# Patient Record
Sex: Male | Born: 1990 | Race: White | Hispanic: No | Marital: Married
Health system: Southern US, Community
[De-identification: ages and names within clinical notes are randomized; demographics above are authoritative.]

## PROBLEM LIST (undated history)

## (undated) HISTORY — PX: TONSILLECTOMY: SUR1361

---

## 2013-05-31 ENCOUNTER — Other Ambulatory Visit: Payer: Self-pay | Admitting: Physician Assistant

## 2013-05-31 ENCOUNTER — Ambulatory Visit
Admission: RE | Admit: 2013-05-31 | Discharge: 2013-05-31 | Disposition: A | Discharge status: Home / Self Care | Payer: 59 | Source: Ambulatory Visit | Attending: Physician Assistant | Admitting: Physician Assistant

## 2013-05-31 DIAGNOSIS — IMO0001 Reserved for inherently not codable concepts without codable children: Secondary | ICD-10-CM

## 2015-07-20 ENCOUNTER — Encounter (HOSPITAL_COMMUNITY): Payer: Self-pay | Admitting: Emergency Medicine

## 2015-07-20 ENCOUNTER — Emergency Department (HOSPITAL_COMMUNITY): Payer: BC Managed Care – PPO

## 2015-07-20 ENCOUNTER — Emergency Department (HOSPITAL_COMMUNITY)
Admission: EM | Admit: 2015-07-20 | Discharge: 2015-07-20 | Disposition: A | Discharge status: Home / Self Care | Payer: BC Managed Care – PPO | Attending: Emergency Medicine | Admitting: Emergency Medicine

## 2015-07-20 DIAGNOSIS — R1031 Right lower quadrant pain: Secondary | ICD-10-CM

## 2015-07-20 DIAGNOSIS — K5900 Constipation, unspecified: Secondary | ICD-10-CM | POA: Diagnosis not present

## 2015-07-20 DIAGNOSIS — K5792 Diverticulitis of intestine, part unspecified, without perforation or abscess without bleeding: Secondary | ICD-10-CM | POA: Diagnosis not present

## 2015-07-20 LAB — URINE MICROSCOPIC-ADD ON: Bacteria, UA: NONE SEEN

## 2015-07-20 LAB — URINALYSIS, ROUTINE W REFLEX MICROSCOPIC
Glucose, UA: NEGATIVE mg/dL
Hgb urine dipstick: NEGATIVE
Ketones, ur: NEGATIVE mg/dL
Leukocytes, UA: NEGATIVE
Nitrite: NEGATIVE
Protein, ur: 30 mg/dL — AB
Specific Gravity, Urine: 1.036 — ABNORMAL HIGH (ref 1.005–1.030)
pH: 6 (ref 5.0–8.0)

## 2015-07-20 LAB — COMPREHENSIVE METABOLIC PANEL
ALT: 48 U/L (ref 17–63)
AST: 22 U/L (ref 15–41)
Albumin: 4.2 g/dL (ref 3.5–5.0)
Alkaline Phosphatase: 35 U/L — ABNORMAL LOW (ref 38–126)
Anion gap: 12 (ref 5–15)
BUN: 17 mg/dL (ref 6–20)
CO2: 26 mmol/L (ref 22–32)
Calcium: 9.2 mg/dL (ref 8.9–10.3)
Chloride: 100 mmol/L — ABNORMAL LOW (ref 101–111)
Creatinine, Ser: 0.98 mg/dL (ref 0.61–1.24)
GFR calc Af Amer: >60 mL/min (ref >60)
GFR calc non Af Amer: >60 mL/min (ref >60)
Glucose, Bld: 102 mg/dL — ABNORMAL HIGH (ref 65–99)
Potassium: 3.7 mmol/L (ref 3.5–5.1)
Sodium: 138 mmol/L (ref 135–145)
Total Bilirubin: 0.9 mg/dL (ref 0.3–1.2)
Total Protein: 8.3 g/dL — ABNORMAL HIGH (ref 6.5–8.1)

## 2015-07-20 LAB — CBC
HCT: 45.8 % (ref 39.0–52.0)
Hemoglobin: 15.2 g/dL (ref 13.0–17.0)
MCH: 30.9 pg (ref 26.0–34.0)
MCHC: 33.2 g/dL (ref 30.0–36.0)
MCV: 93.1 fL (ref 78.0–100.0)
Platelets: 258 10*3/uL (ref 150–400)
RBC: 4.92 MIL/uL (ref 4.22–5.81)
RDW: 12.6 % (ref 11.5–15.5)
WBC: 15.2 10*3/uL — ABNORMAL HIGH (ref 4.0–10.5)

## 2015-07-20 LAB — LIPASE, BLOOD: Lipase: 20 U/L (ref 11–51)

## 2015-07-20 MED ORDER — OXYCODONE-ACETAMINOPHEN 5-325 MG PO TABS: 1.0000 | ORAL_TABLET | ORAL | Status: AC | PRN

## 2015-07-20 MED ORDER — MORPHINE SULFATE (PF) 4 MG/ML IV SOLN
4.0000 mg | Freq: Once | INTRAVENOUS | Status: AC
  Administered 2015-07-20: 4 mg via INTRAVENOUS
  Filled 2015-07-20: qty 1

## 2015-07-20 MED ORDER — ONDANSETRON 4 MG PO TBDP: 4.0000 mg | ORAL_TABLET | Freq: Three times a day (TID) | ORAL | Status: AC | PRN

## 2015-07-20 MED ORDER — METRONIDAZOLE 500 MG PO TABS: 500.0000 mg | ORAL_TABLET | Freq: Two times a day (BID) | ORAL | Status: AC

## 2015-07-20 MED ORDER — SODIUM CHLORIDE 0.9 % IV BOLUS (SEPSIS)
1000.0000 mL | Freq: Once | INTRAVENOUS | Status: AC
  Administered 2015-07-20: 1000 mL via INTRAVENOUS

## 2015-07-20 MED ORDER — CIPROFLOXACIN HCL 500 MG PO TABS: 500.0000 mg | ORAL_TABLET | Freq: Two times a day (BID) | ORAL | Status: AC

## 2015-07-20 MED ORDER — IOPAMIDOL (ISOVUE-300) INJECTION 61%
100.0000 mL | Freq: Once | INTRAVENOUS | Status: AC | PRN
  Administered 2015-07-20: 100 mL via INTRAVENOUS

## 2015-07-20 NOTE — Discharge Instructions (Signed)
1. Medications: cipro and flagyl (antibiotics), percocet for pain, zofran for nausea, usual home medications 2. Treatment: rest, drink plenty of fluids 3. Follow Up: please followup with gastroenterology this week for discussion of your diagnoses and further evaluation after today's visit; please return to the ER for high fever, severe pain, vomiting, new or worsening symptoms    Diverticulitis Diverticulitis is when small pockets that have formed in your colon (large intestine) become infected or swollen. HOME CARE  Follow your doctor's instructions.  Follow a special diet if told by your doctor.  When you feel better, your doctor may tell you to change your diet. You may be told to eat a lot of fiber. Fruits and vegetables are good sources of fiber. Fiber makes it easier to poop (have bowel movements).  Take supplements or probiotics as told by your doctor.  Only take medicines as told by your doctor.  Keep all follow-up visits with your doctor. GET HELP IF:  Your pain does not get better.  You have a hard time eating food.  You are not pooping like normal. GET HELP RIGHT AWAY IF:  Your pain gets worse.  Your problems do not get better.  Your problems suddenly get worse.  You have a fever.  You keep throwing up (vomiting).  You have bloody or black, tarry poop (stool). MAKE SURE YOU:   Understand these instructions.  Will watch your condition.  Will get help right away if you are not doing well or get worse.   This information is not intended to replace advice given to you by your health care provider. Make sure you discuss any questions you have with your health care provider.   Document Released: 09/14/2007 Document Revised: 04/02/2013 Document Reviewed: 02/20/2013 Elsevier Interactive Patient Education [employer]2016 Elsevier Inc.

## 2015-07-20 NOTE — ED Provider Notes (Signed)
CSN: [id number]     Arrival date & time 07/20/15  1251 History   First MD Initiated Contact with Patient 07/20/15 1512     Chief Complaint  Patient presents with  . Abdominal Pain  . Diarrhea    HPI   Dan Wright is a 25 y.o. male with no pertinent PMH who presents to the ED with RLQ abdominal pain. He states his symptoms initially started 2 weeks ago, though improved and recurred Thursday. He notes progressively worsening pain since that time. He denies exacerbating or alleviating factors. He states he has had fever to 100. He reports vomiting on Friday and nausea throughout the weekend, now resolved. He also states he has had both diarrhea and constipation, and notes his last bowel movement was this afternoon and was loose. He denies hematochezia, melena, dysuria, urgency, frequency, penile pain/swelling/discharge, testicular pain/swelling. He notes he was evaluated by urgent care prior to arrival and sent to the ED for imaging.    History reviewed. No pertinent past medical history. Past Surgical History  Procedure Laterality Date  . Tonsillectomy     History reviewed. No pertinent family history. Social History  Substance Use Topics  . Smoking status: None  . Smokeless tobacco: None  . Alcohol Use: None     Review of Systems  Constitutional: Positive for fever. Negative for chills.  Respiratory: Negative for shortness of breath.   Cardiovascular: Negative for chest pain.  Gastrointestinal: Positive for nausea, vomiting, abdominal pain, diarrhea and constipation. Negative for blood in stool.  Genitourinary: Negative for dysuria, urgency, frequency, hematuria, penile swelling, scrotal swelling, penile pain and testicular pain.  All other systems reviewed and are negative.     Allergies  Review of patient's allergies indicates no known allergies.  Home Medications   Prior to Admission medications   Medication Sig Start Date End Date Taking? Authorizing Provider   acetaminophen (TYLENOL) 500 MG tablet Take 1,000 mg by mouth every 8 (eight) hours as needed for mild pain, moderate pain, fever or headache.   Yes Historical Provider, MD  Simethicone (GAS-X PO) Take 2 tablets by mouth daily as needed (for gas).   Yes Historical Provider, MD  ciprofloxacin (CIPRO) 500 MG tablet Take 1 tablet (500 mg total) by mouth every 12 (twelve) hours. 07/20/15   Mady Gemma, PA-C  metroNIDAZOLE (FLAGYL) 500 MG tablet Take 1 tablet (500 mg total) by mouth 2 (two) times daily. 07/20/15   Mady Gemma, PA-C  ondansetron (ZOFRAN ODT) 4 MG disintegrating tablet Take 1 tablet (4 mg total) by mouth every 8 (eight) hours as needed for nausea. 07/20/15   Mady Gemma, PA-C  oxyCODONE-acetaminophen (PERCOCET/ROXICET) 5-325 MG tablet Take 1-2 tablets by mouth every 4 (four) hours as needed for severe pain. 07/20/15   Mady Gemma, PA-C    BP 118/78 mmHg  Pulse 87  Temp(Src) 99.2 F (37.3 C) (Oral)  Resp 14  SpO2 100% Physical Exam  Constitutional: He is oriented to person, place, and time. He appears well-developed and well-nourished. No distress.  HENT:  Head: Normocephalic and atraumatic.  Right Ear: External ear normal.  Left Ear: External ear normal.  Nose: Nose normal.  Mouth/Throat: Uvula is midline, oropharynx is clear and moist and mucous membranes are normal.  Eyes: Conjunctivae, EOM and lids are normal. Pupils are equal, round, and reactive to light. Right eye exhibits no discharge. Left eye exhibits no discharge. No scleral icterus.  Neck: Normal range of motion. Neck supple.  Cardiovascular:  Normal rate, regular rhythm, normal heart sounds, intact distal pulses and normal pulses.   Pulmonary/Chest: Effort normal and breath sounds normal. No respiratory distress. He has no wheezes. He has no rales.  Abdominal: Soft. Normal appearance and bowel sounds are normal. He exhibits no distension and no mass. There is tenderness. There is no  rigidity, no rebound and no guarding.  TTP in periumbilical region and lower quadrants, worse in RLQ. No rebound, guarding, or palpable masses.  Musculoskeletal: Normal range of motion. He exhibits no edema or tenderness.  Neurological: He is alert and oriented to person, place, and time. He has normal strength. No sensory deficit.  Skin: Skin is warm, dry and intact. No rash noted. He is not diaphoretic. No erythema. No pallor.  Psychiatric: He has a normal mood and affect. His speech is normal and behavior is normal.  Nursing note and vitals reviewed.   ED Course  Procedures (including critical care time)  Labs Review Labs Reviewed  COMPREHENSIVE METABOLIC PANEL - Abnormal; Notable for the following:    Chloride 100 (*)    Glucose, Bld 102 (*)    Total Protein 8.3 (*)    Alkaline Phosphatase 35 (*)    All other components within normal limits  CBC - Abnormal; Notable for the following:    WBC 15.2 (*)    All other components within normal limits  URINALYSIS, ROUTINE W REFLEX MICROSCOPIC (NOT AT [facility]) - Abnormal; Notable for the following:    Color, Urine AMBER (*)    Specific Gravity, Urine 1.036 (*)    Bilirubin Urine SMALL (*)    Protein, ur 30 (*)    All other components within normal limits  URINE MICROSCOPIC-ADD ON - Abnormal; Notable for the following:    Squamous Epithelial / LPF 0-5 (*)    All other components within normal limits  LIPASE, BLOOD    Imaging Review Ct Abdomen Pelvis W Contrast  07/20/2015  CLINICAL DATA:  Acute onset of right lower quadrant abdominal pain and tenderness. Nausea, vomiting and diarrhea. Initial encounter. EXAM: CT ABDOMEN AND PELVIS WITH CONTRAST TECHNIQUE: Multidetector CT imaging of the abdomen and pelvis was performed using the standard protocol following bolus administration of intravenous contrast. CONTRAST:  ISOVUE-300 IOPAMIDOL (ISOVUE-300) INJECTION 61% COMPARISON:  None. FINDINGS: The visualized lung bases are clear. The  liver and spleen are unremarkable in appearance. A tiny stone is noted within the gallbladder. The gallbladder is otherwise unremarkable. The pancreas and adrenal glands are unremarkable. The kidneys are unremarkable in appearance. There is no evidence of hydronephrosis. No renal or ureteral stones are seen. No perinephric stranding is appreciated. No free fluid is identified. The small bowel is unremarkable in appearance. The stomach is within normal limits. No acute vascular abnormalities are seen. The appendix is normal in caliber, without evidence of appendicitis. Focal soft tissue inflammation is noted at the mid sigmoid colon, with underlying inflamed diverticula, and a vague adjacent 2.9 cm focus of fluid. No well defined abscess is yet seen. Findings are compatible with acute diverticulitis. There is no evidence of perforation. The bladder is mildly distended and grossly unremarkable. The prostate remains normal in size. No inguinal lymphadenopathy is seen. No acute osseous abnormalities are identified. IMPRESSION: 1. Acute diverticulitis at the mid sigmoid colon, with focal soft tissue inflammation and underlying inflamed diverticula, and vague adjacent 2.9 cm focus of fluid. No well defined abscess is yet seen. No evidence of perforation at this time. 2. Cholelithiasis.  Gallbladder otherwise unremarkable.  Electronically Signed   By: Roanna RaiderJeffery  Chang M.D.   On: 07/20/2015 18:56   I have personally reviewed and evaluated these images and lab results as part of my medical decision-making.   EKG Interpretation None      MDM   Final diagnoses:  RLQ abdominal pain  Acute diverticulitis    25 year old male presents with RLQ abdominal pain, worsening over the past few days. Notes associated fever, nausea, vomiting, and diarrhea. Denies hematochezia, melena, dysuria, urgency, frequency, penile pain/swelling/discharge, testicular pain/swelling.  Patient's temp 99.2. Vital signs stable. Heart RRR.  Lungs clear to auscultation bilaterally. Abdomen soft, non-distended, with TTP in periumbilical region and lower quadrants, worse in RLQ. No rebound, guarding, or palpable masses.  CBC remarkable for leukocytosis of 15.2, no anemia. CMP unremarkable. UA negative for infection.  Will obtain CT abdomen pelvis. Patient given fluids and pain medication.  CT abdomen pelvis remarkable for acute diverticulitis at the mid sigmoid colon, with focal soft tissue inflammation and underlying inflamed diverticular, and vague adjacent 2.9 cm focus of fluid, no well defined abscess yet seen, no evidence of perforation.  On reassessment of patient, he reports significant symptom improvement. He is non-toxic and well-appearing, feel he is stable for discharge at this time. Will treat with cipro and flagyl and give pain medication and zofran for home. Patient to follow-up with GI. Strict return precautions discussed. Patient verbalizes his understanding and is in agreement with plan.  BP 118/78 mmHg  Pulse 87  Temp(Src) 99.2 F (37.3 C) (Oral)  Resp 14  SpO2 100%       Mady Gemmalizabeth C Elinore Shults, PA-C 07/21/15 0118  Laurence Spatesachel Morgan Little, MD 07/22/15 1504

## 2015-07-20 NOTE — ED Notes (Signed)
Right sided lower abdominal pain with tenderness x 1 week with nausea, vomiting, and diarrhea. Went to urgent care and they sent him here. Has not taken any medication today. Was sent here for "CT scan, r/o appendicitis"

## 2015-07-20 NOTE — Progress Notes (Signed)
Patient listed as not having insurance or a pcp.  EDCm spoke to patient at bedside.  Patient reports he has [employer] insurance and his pcp is Dr. Manus [facility]Ehinger at [facility].  System updated.

## 2017-05-25 ENCOUNTER — Other Ambulatory Visit: Payer: Self-pay | Admitting: Family Medicine

## 2017-05-25 DIAGNOSIS — R748 Abnormal levels of other serum enzymes: Secondary | ICD-10-CM

## 2017-06-02 ENCOUNTER — Ambulatory Visit
Admission: RE | Admit: 2017-06-02 | Discharge: 2017-06-02 | Disposition: A | Discharge status: Home / Self Care | Payer: BC Managed Care – PPO | Source: Ambulatory Visit | Attending: Family Medicine | Admitting: Family Medicine

## 2017-06-02 DIAGNOSIS — R748 Abnormal levels of other serum enzymes: Secondary | ICD-10-CM

## 2019-06-07 IMAGING — US US ABDOMEN LIMITED
1 series · 14 of 25 positions shown · non-contrast
Comparison: CT 07/20/2015

CLINICAL DATA: Elevated liver enzymes.

EXAM:
ULTRASOUND ABDOMEN LIMITED RIGHT UPPER QUADRANT

[Series 1: us abdomen limited · 0.28mm/px · 14 of 45 slices shown]
[im 1/45]
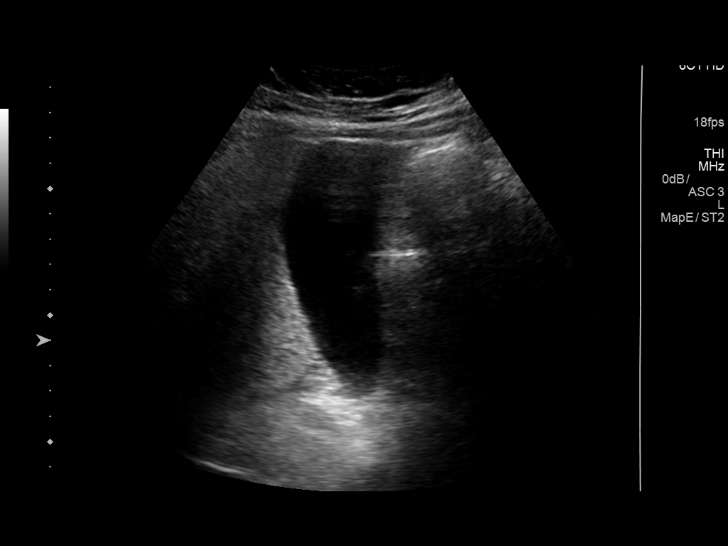
[im 4/45]
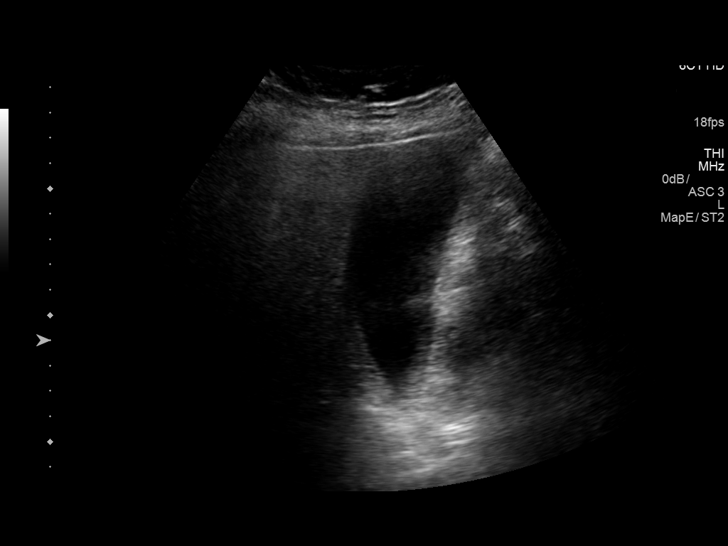
[im 8/45]
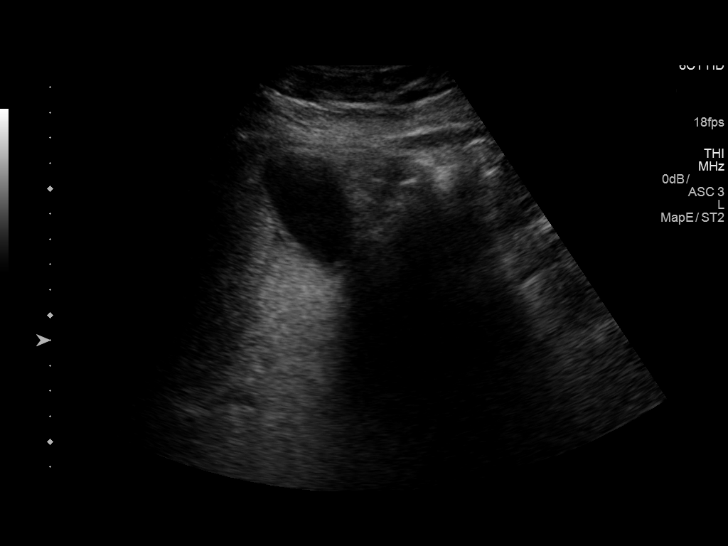
[im 12/45]
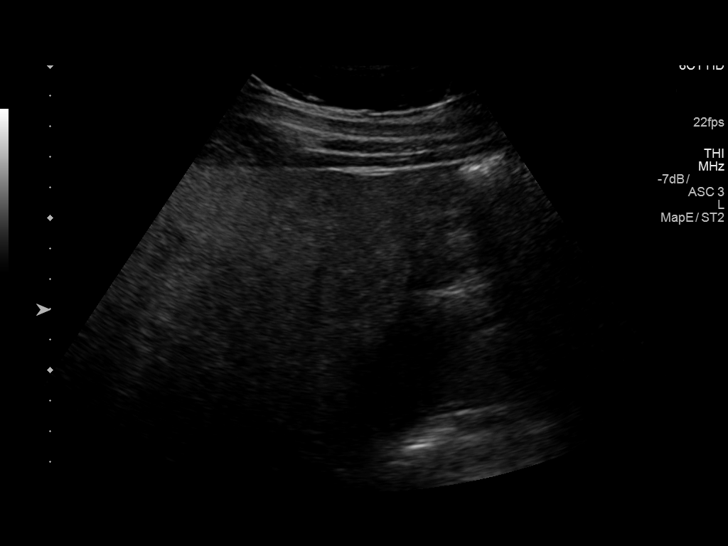
[im 15/45]
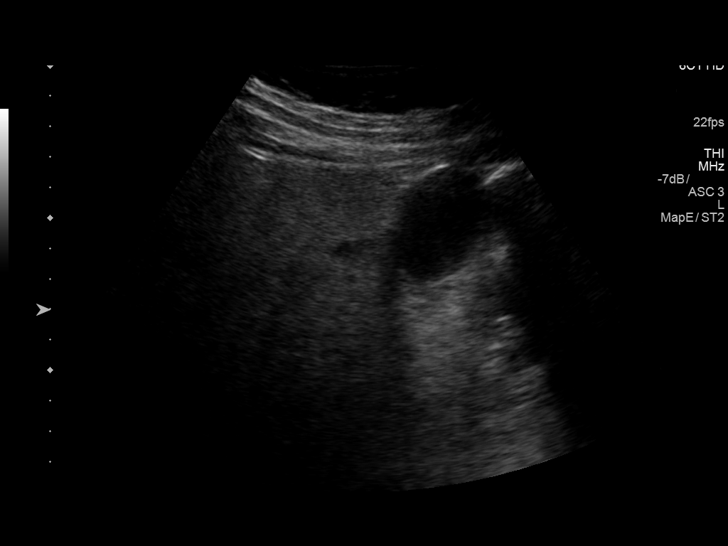
[im 17/45]
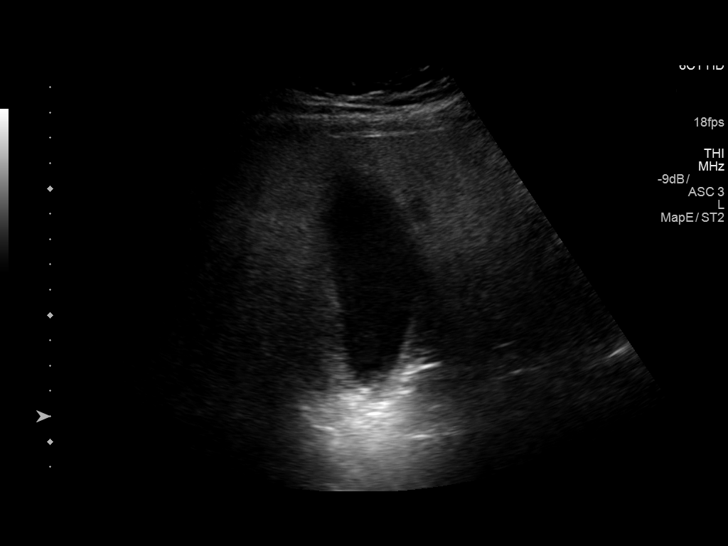
[im 21/45]
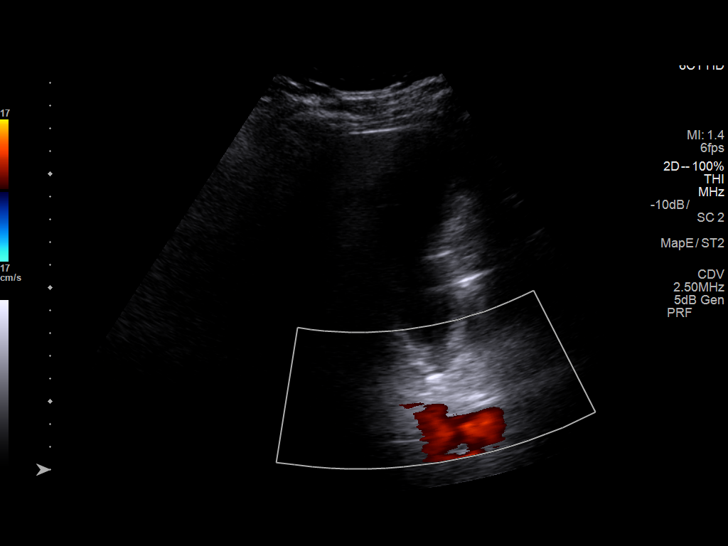
[im 24/45]
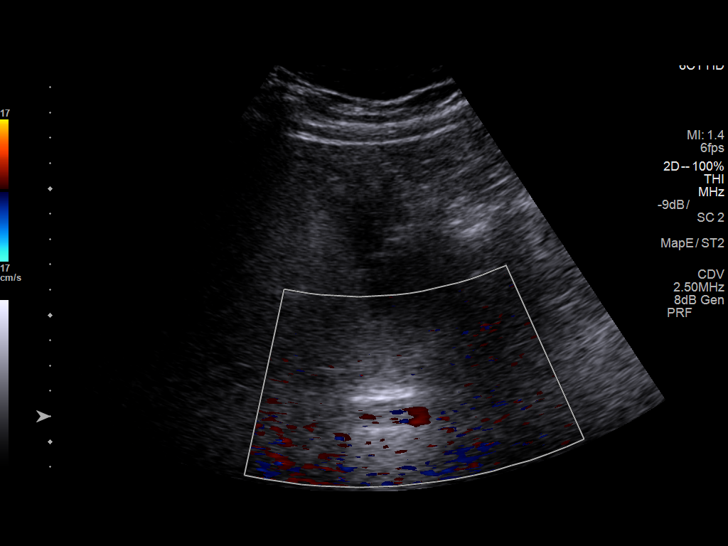
[im 28/45]
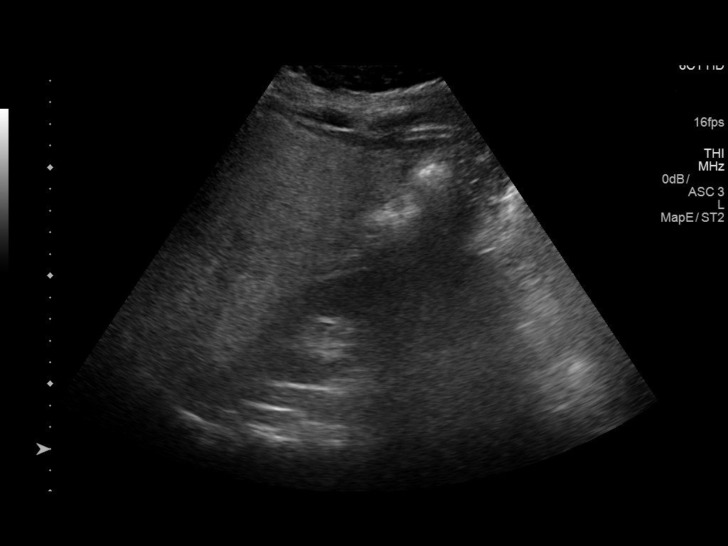
[im 30/45]
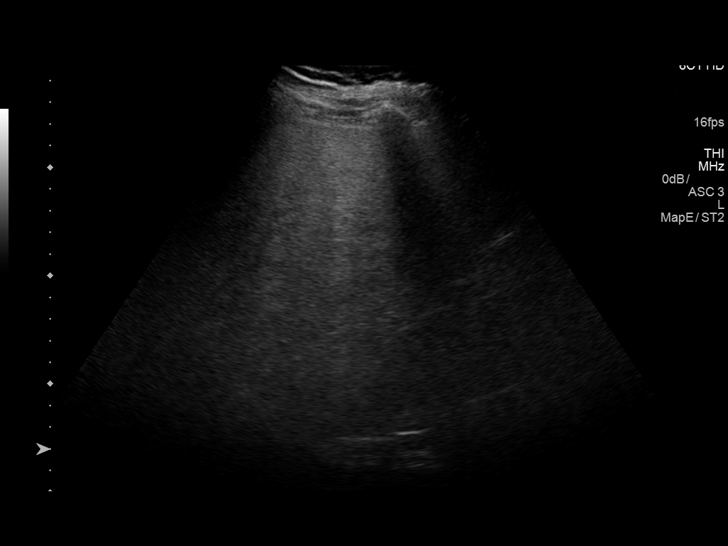
[im 34/45]
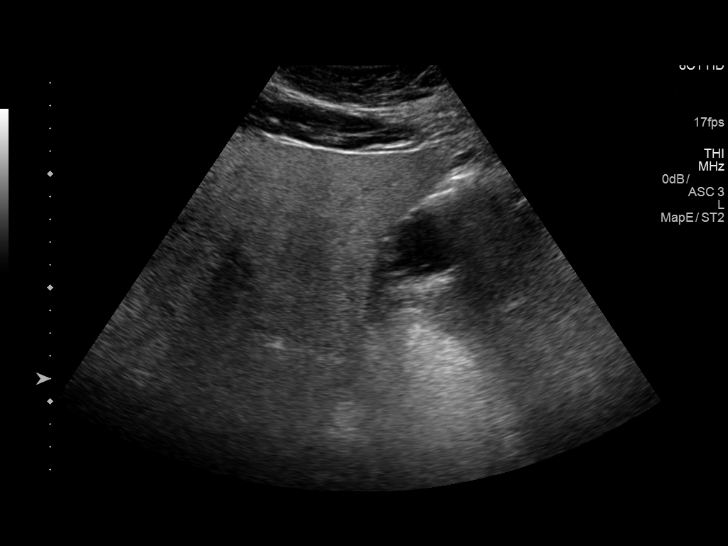
[im 37/45]
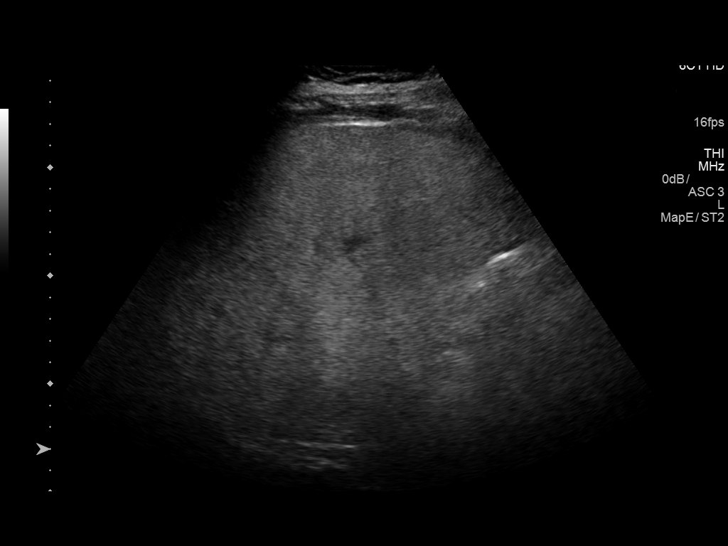
[im 41/45]
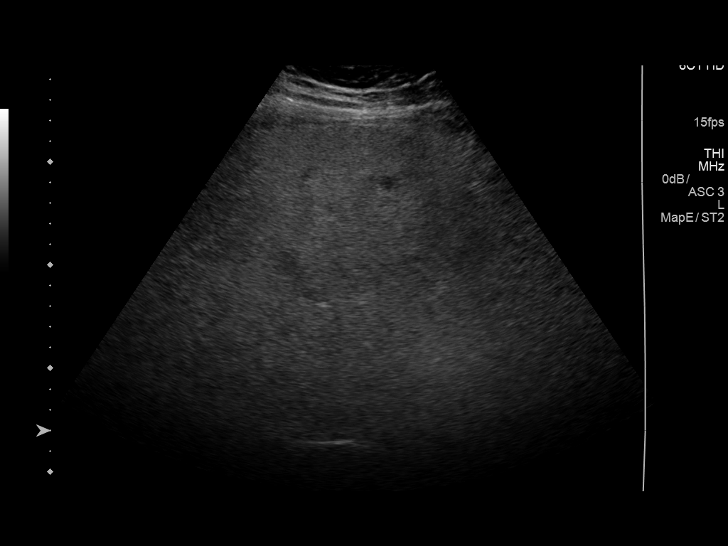
[im 45/45]
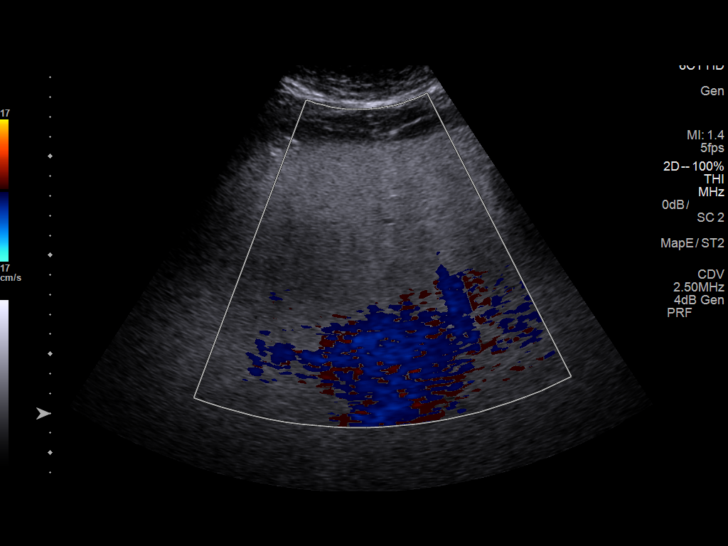

[14 of 25 positions shown; findings below may reference images not displayed]

FINDINGS: Gallbladder:

Normal appearance today without evidence stones, sludge, wall
thickening or surrounding fluid. Tiny stone seen on previous CT not
visible.

Common bile duct:

Diameter: 3 mm, normal

Liver:

Diffusely echogenic suggesting fatty change. No focal lesion. No
ductal dilatation. Portal vein is patent on color Doppler imaging
with normal direction of blood flow towards the liver.
IMPRESSION: Diffusely echogenic liver consistent with steatosis.

Gallbladder and ductal system appear normal. Tiny stone dependent in
the gallbladder seen on a CT scan of 9731 is not visualized on
today's ultrasound.

## 2020-02-13 ENCOUNTER — Other Ambulatory Visit (HOSPITAL_BASED_OUTPATIENT_CLINIC_OR_DEPARTMENT_OTHER): Payer: Self-pay | Admitting: Family Medicine

## 2020-02-13 ENCOUNTER — Ambulatory Visit (HOSPITAL_BASED_OUTPATIENT_CLINIC_OR_DEPARTMENT_OTHER)
Admission: RE | Admit: 2020-02-13 | Discharge: 2020-02-13 | Disposition: A | Discharge status: Home / Self Care | Payer: Managed Care, Other (non HMO) | Source: Ambulatory Visit | Attending: Family Medicine | Admitting: Family Medicine

## 2020-02-13 ENCOUNTER — Other Ambulatory Visit: Payer: Self-pay

## 2020-02-13 DIAGNOSIS — Q559 Congenital malformation of male genital organ, unspecified: Secondary | ICD-10-CM

## 2020-02-13 DIAGNOSIS — N50819 Testicular pain, unspecified: Secondary | ICD-10-CM | POA: Insufficient documentation

## 2020-10-22 ENCOUNTER — Other Ambulatory Visit (HOSPITAL_COMMUNITY): Payer: Self-pay | Admitting: Home Modifications

## 2020-10-22 ENCOUNTER — Other Ambulatory Visit: Payer: Self-pay | Admitting: Home Modifications

## 2020-10-22 ENCOUNTER — Ambulatory Visit (HOSPITAL_BASED_OUTPATIENT_CLINIC_OR_DEPARTMENT_OTHER): Admission: RE | Admit: 2020-10-22 | Payer: Managed Care, Other (non HMO) | Source: Ambulatory Visit

## 2020-10-22 DIAGNOSIS — R1031 Right lower quadrant pain: Secondary | ICD-10-CM

## 2020-10-23 ENCOUNTER — Other Ambulatory Visit: Payer: Self-pay

## 2020-10-23 ENCOUNTER — Ambulatory Visit (HOSPITAL_BASED_OUTPATIENT_CLINIC_OR_DEPARTMENT_OTHER)
Admission: RE | Admit: 2020-10-23 | Discharge: 2020-10-23 | Disposition: A | Discharge status: Home / Self Care | Payer: Managed Care, Other (non HMO) | Source: Ambulatory Visit | Attending: Home Modifications | Admitting: Home Modifications

## 2020-10-23 DIAGNOSIS — R1031 Right lower quadrant pain: Secondary | ICD-10-CM | POA: Diagnosis not present

## 2020-10-23 MED ORDER — IOHEXOL 300 MG/ML  SOLN
100.0000 mL | Freq: Once | INTRAMUSCULAR | Status: AC | PRN
  Administered 2020-10-23: 100 mL via INTRAVENOUS

## 2021-07-01 IMAGING — US US SCROTUM W/ DOPPLER COMPLETE
1 series · 13 of 25 positions shown · non-contrast
Comparison: None.

CLINICAL DATA: Testicular discomfort. Scrotal asymmetry on physical
exam.

EXAM:
SCROTAL ULTRASOUND
DOPPLER ULTRASOUND OF THE TESTICLES
TECHNIQUE: Complete ultrasound examination of the testicles, epididymis, and
other scrotal structures was performed. Color and spectral Doppler
ultrasound were also utilized to evaluate blood flow to the
testicles.

[Series 1: us scrotum w/ doppler complete · 13 of 27 slices shown]
[im 1/27]
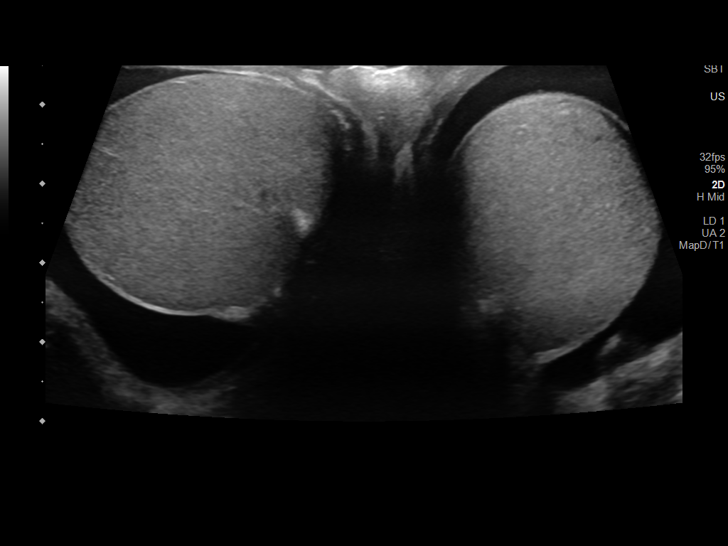
[im 3/27]
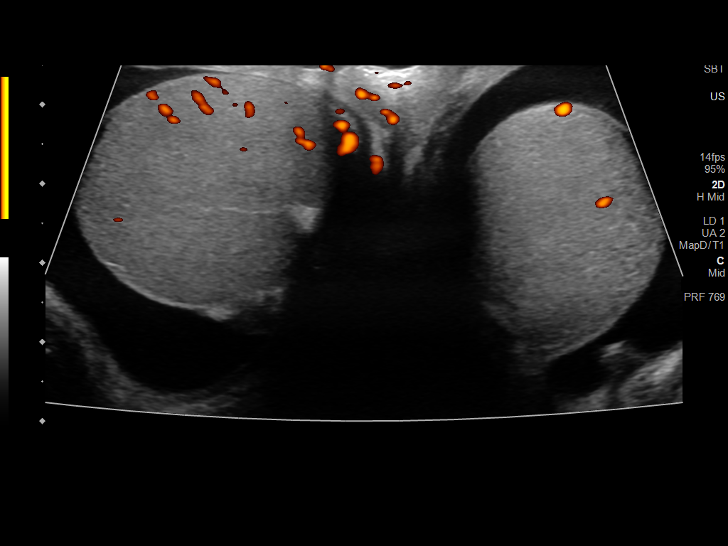
[im 5/27]
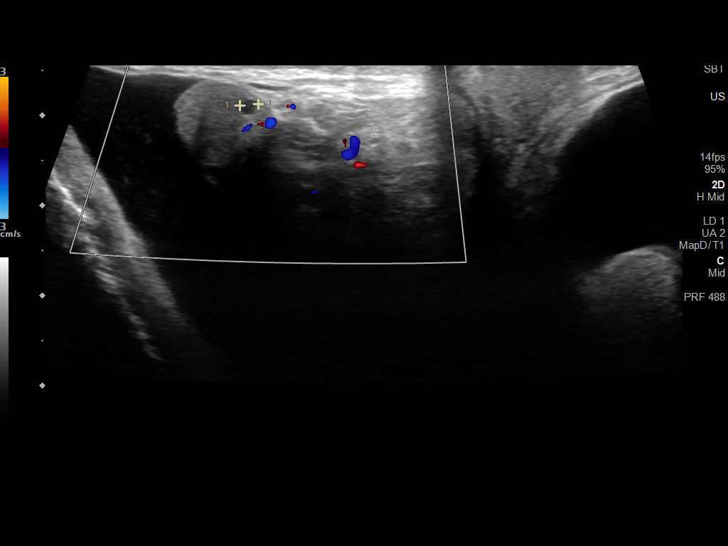
[im 7/27]
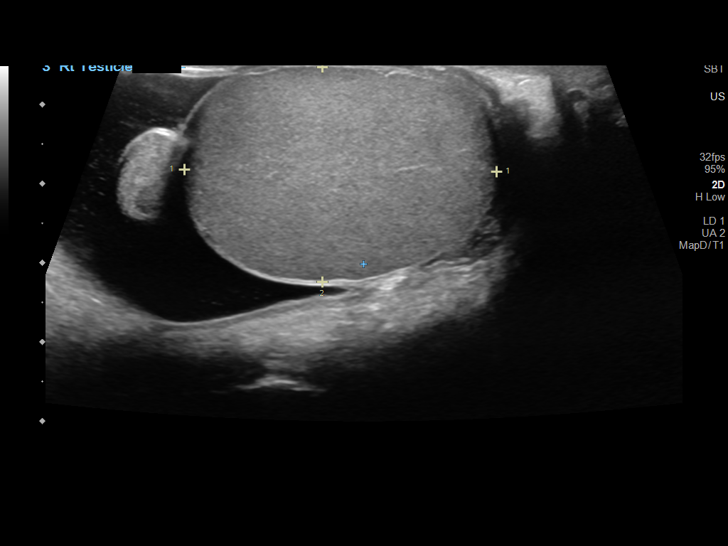
[im 9/27]
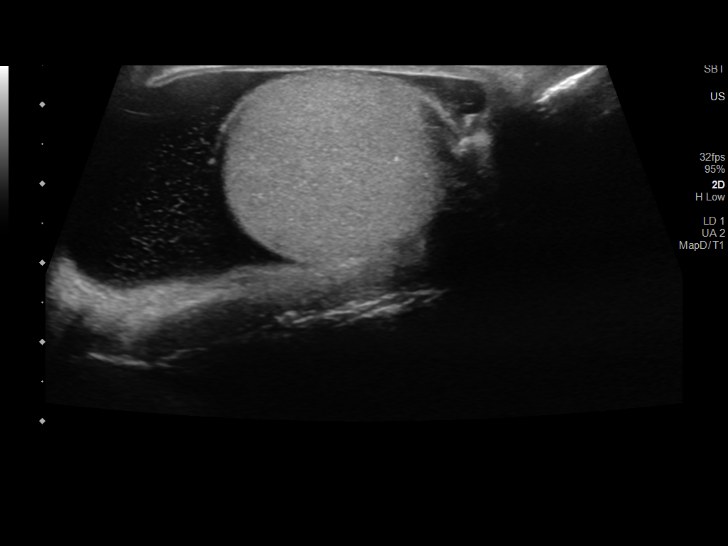
[im 11/27]
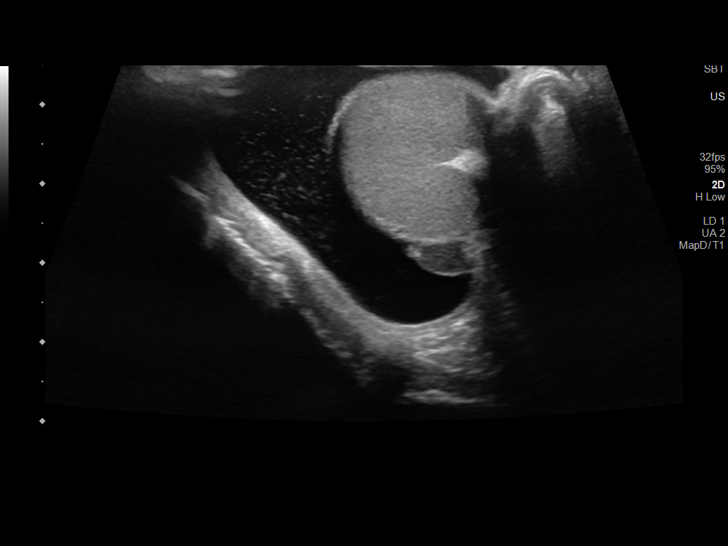
[im 14/27]
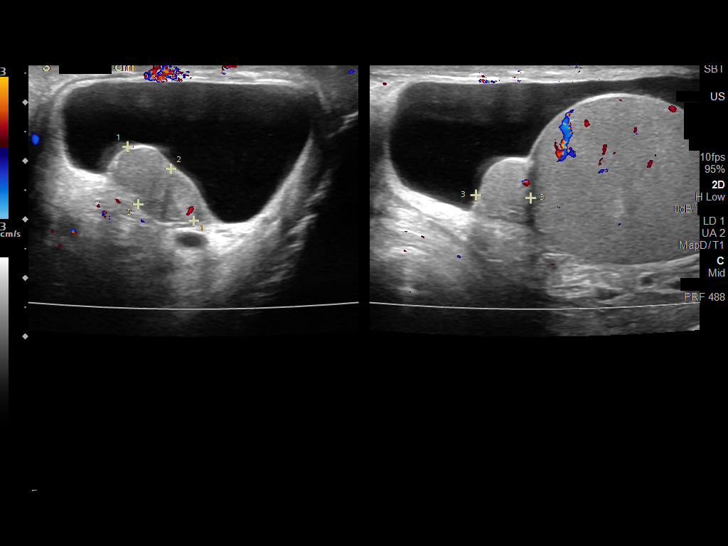
[im 16/27]
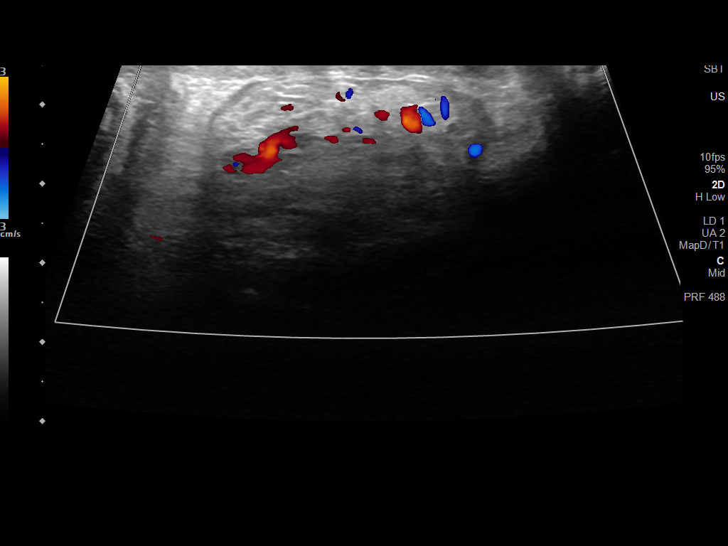
[im 18/27]
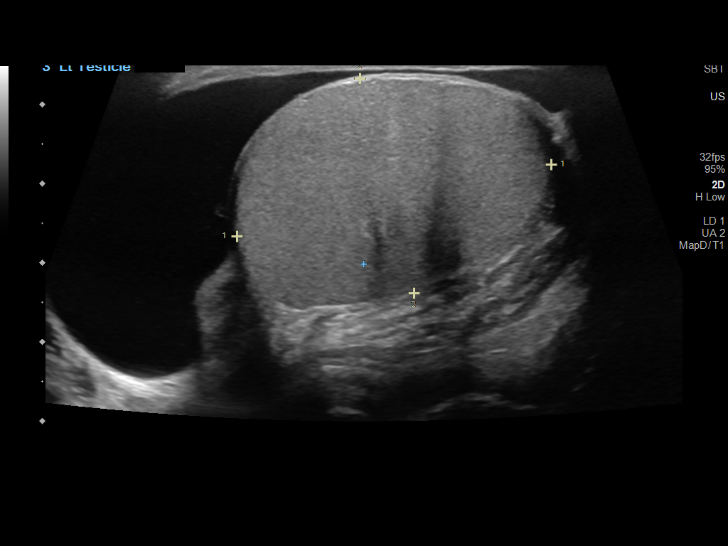
[im 20/27]
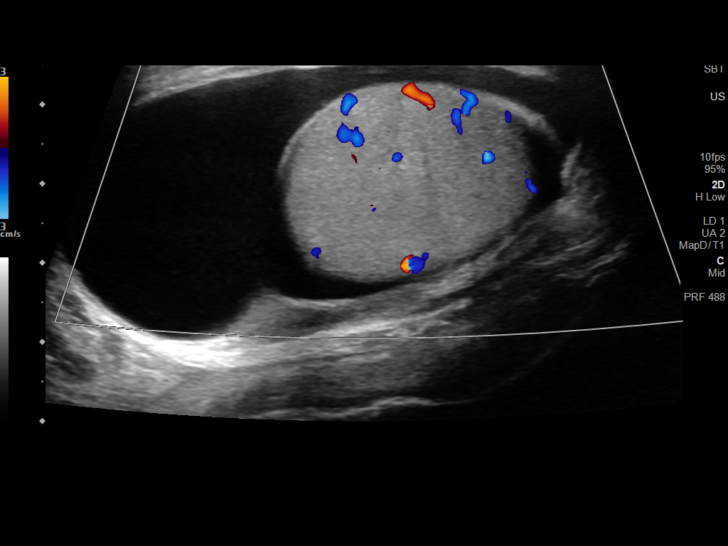
[im 22/27]
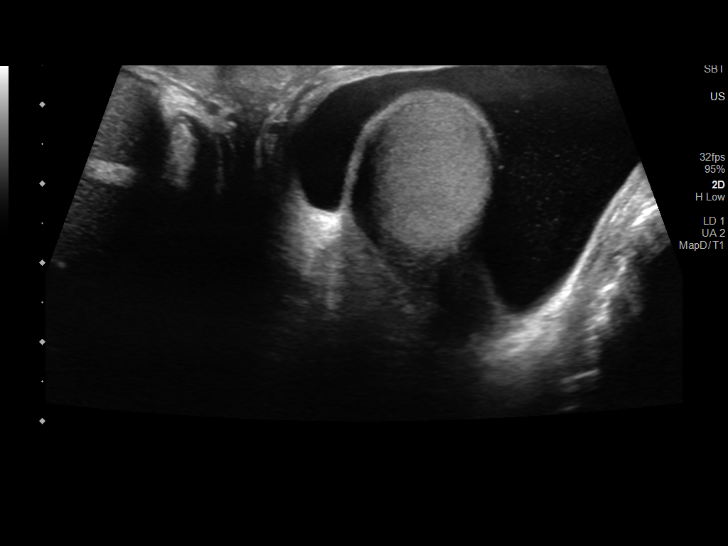
[im 24/27]
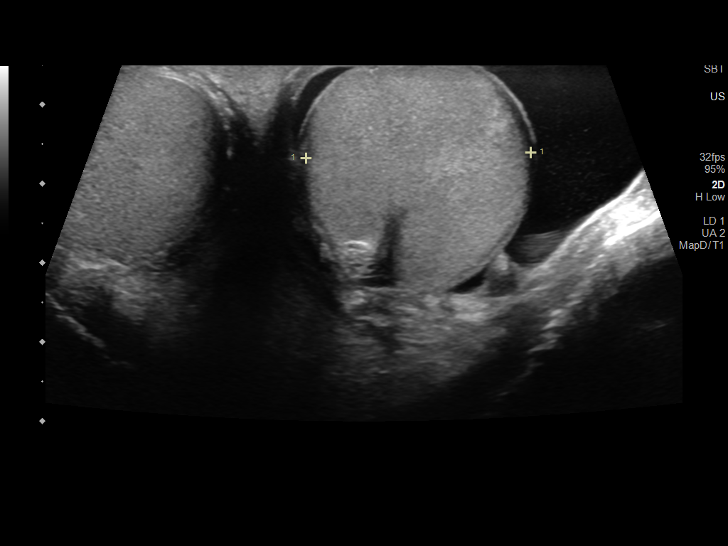
[im 27/27]
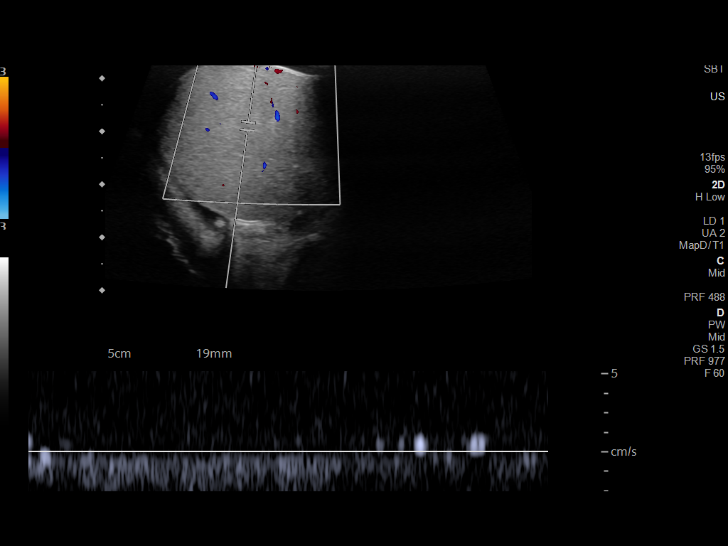

[13 of 25 positions shown; findings below may reference images not displayed]

FINDINGS: Right testicle

Measurements: 3.9 x 2.7 x 3.2 cm. Homogeneous echogenicity. Normal
blood flow. No mass or microlithiasis visualized.

Left testicle

Measurements: 4.1 x 2.8 x 2.8 cm. Homogeneous echogenicity. Normal
blood flow. No mass or microlithiasis visualized.

Right epididymis: Normal in size. Tiny incidental epididymal head
cyst measures 2 mm. No hyperemia.

Left epididymis: Normal in size and appearance. Incidental small
epididymal appendage.

Hydrocele: Small to moderate right and moderate left, minimal
internal complexity.

Varicocele:  None visualized.

Pulsed Doppler interrogation of both testes demonstrates normal low
resistance arterial and venous waveforms bilaterally.
IMPRESSION: 1. Small to moderate right and moderate left hydroceles.
2. Normal sonographic appearance of the testes with normal blood
flow.

## 2022-02-14 ENCOUNTER — Ambulatory Visit
Admission: RE | Admit: 2022-02-14 | Discharge: 2022-02-14 | Disposition: A | Discharge status: Home / Self Care | Payer: Managed Care, Other (non HMO) | Source: Ambulatory Visit | Attending: Physician Assistant | Admitting: Physician Assistant

## 2022-02-14 VITALS — BP 169/88 | HR 92 | Temp 98.6°F | Resp 18

## 2022-02-14 DIAGNOSIS — J209 Acute bronchitis, unspecified: Secondary | ICD-10-CM | POA: Diagnosis not present

## 2022-02-14 DIAGNOSIS — H65192 Other acute nonsuppurative otitis media, left ear: Secondary | ICD-10-CM

## 2022-02-14 MED ORDER — PREDNISONE 20 MG PO TABS: 40.0000 mg | ORAL_TABLET | Freq: Every day | ORAL | 0 refills | Status: AC

## 2022-02-14 MED ORDER — SULFAMETHOXAZOLE-TRIMETHOPRIM 800-160 MG PO TABS: 1.0000 | ORAL_TABLET | Freq: Two times a day (BID) | ORAL | 0 refills | Status: AC

## 2022-02-14 NOTE — ED Provider Notes (Signed)
[facility]    CSN: [id number] Arrival date & time: 02/14/22  1721      History   Chief Complaint Chief Complaint  Patient presents with   Cough    When I breath I can hear or fell cracking and popping when I take a deep breath. Cough has been going on for week or so. - Entered by patient    HPI Dan Wright is a 31 y.o. male.   Patient here today for evaluation of cough and congestion he has had for the last week.  He states that he feels a cracking or popping sensation when he takes a deep breath at times.  He also notes some chest tightness.  He has not had any fever.  He denies sore throat.  He has had intermittent ear pain, and does have history of ear infections as well.  He has tried taking over-the-counter medication without resolution of symptoms.  The history is provided by the patient.  Cough Associated symptoms: ear pain   Associated symptoms: no chills, no eye discharge, no fever, no shortness of breath and no sore throat     History reviewed. No pertinent past medical history.  There are no problems to display for this patient.   Past Surgical History:  Procedure Laterality Date   TONSILLECTOMY         Home Medications    Prior to Admission medications   Medication Sig Start Date End Date Taking? Authorizing Provider  predniSONE (DELTASONE) 20 MG tablet Take 2 tablets (40 mg total) by mouth daily with breakfast for 5 days. 02/14/22 02/19/22 Yes Francene Finders, PA-C  sulfamethoxazole-trimethoprim (BACTRIM DS) 800-160 MG tablet Take 1 tablet by mouth 2 (two) times daily for 7 days. 02/14/22 02/21/22 Yes Francene Finders, PA-C  acetaminophen (TYLENOL) 500 MG tablet Take 1,000 mg by mouth every 8 (eight) hours as needed for mild pain, moderate pain, fever or headache.    [provider]  ciprofloxacin (CIPRO) 500 MG tablet Take 1 tablet (500 mg total) by mouth every 12 (twelve) hours. 07/20/15   Marella Chimes, PA-C   metroNIDAZOLE (FLAGYL) 500 MG tablet Take 1 tablet (500 mg total) by mouth 2 (two) times daily. 07/20/15   Marella Chimes, PA-C  ondansetron (ZOFRAN ODT) 4 MG disintegrating tablet Take 1 tablet (4 mg total) by mouth every 8 (eight) hours as needed for nausea. 07/20/15   Marella Chimes, PA-C  oxyCODONE-acetaminophen (PERCOCET/ROXICET) 5-325 MG tablet Take 1-2 tablets by mouth every 4 (four) hours as needed for severe pain. 07/20/15   Marella Chimes, PA-C  Simethicone (GAS-X PO) Take 2 tablets by mouth daily as needed (for gas).    [provider]    Family History Family History  Family history unknown: Yes    Social History Social History   Tobacco Use   Smoking status: Unknown     Allergies   Amoxicillin and Doxycycline hyclate   Review of Systems Review of Systems  Constitutional:  Negative for chills and fever.  HENT:  Positive for congestion and ear pain. Negative for sore throat.   Eyes:  Negative for discharge and redness.  Respiratory:  Positive for cough. Negative for shortness of breath.   Gastrointestinal:  Negative for abdominal pain, diarrhea, nausea and vomiting.     Physical Exam Triage Vital Signs ED Triage Vitals [02/14/22 1807]  Enc Vitals Group     BP      Pulse  Resp      Temp      Temp src      SpO2      Weight      Height      Head Circumference      Peak Flow      Pain Score 6     Pain Loc      Pain Edu?      Excl. in GC?    No data found.  Updated Vital Signs BP (!) 169/88 (BP Location: Left Arm)   Pulse 92   Temp 98.6 F (37 C) (Oral)   Resp 18   SpO2 96%   Physical Exam Vitals and nursing note reviewed.  Constitutional:      General: He is not in acute distress.    Appearance: Normal appearance. He is not ill-appearing.  HENT:     Head: Normocephalic and atraumatic.     Right Ear: Tympanic membrane normal.     Ears:     Comments: Left TM injected and retracted    Nose: Nose normal. No  congestion.     Mouth/Throat:     Mouth: Mucous membranes are moist.     Pharynx: Oropharynx is clear. No oropharyngeal exudate or posterior oropharyngeal erythema.  Eyes:     Conjunctiva/sclera: Conjunctivae normal.  Cardiovascular:     Rate and Rhythm: Normal rate and regular rhythm.     Heart sounds: Normal heart sounds. No murmur heard. Pulmonary:     Effort: Pulmonary effort is normal. No respiratory distress.     Breath sounds: Normal breath sounds. No wheezing, rhonchi or rales.  Skin:    General: Skin is warm and dry.  Neurological:     Mental Status: He is alert.  Psychiatric:        Mood and Affect: Mood normal.        Thought Content: Thought content normal.      UC Treatments / Results  Labs (all labs ordered are listed, but only abnormal results are displayed) Labs Reviewed - No data to display  EKG   Radiology No results found.  Procedures Procedures (including critical care time)  Medications Ordered in UC Medications - No data to display  Initial Impression / Assessment and Plan / UC Course  I have reviewed the triage vital signs and the nursing notes.  Pertinent labs & imaging results that were available during my care of the patient were reviewed by me and considered in my medical decision making (see chart for details).    We will treat to cover bronchitis as well as otitis media.  Steroid burst and Bactrim prescribed.  Encouraged follow-up if no gradual improvement or with any further concerns.  Patient expresses understanding.  Final Clinical Impressions(s) / UC Diagnoses   Final diagnoses:  Other acute nonsuppurative otitis media of left ear, recurrence not specified  Acute bronchitis, unspecified organism   Discharge Instructions   None    ED Prescriptions     Medication Sig Dispense Auth. Provider   predniSONE (DELTASONE) 20 MG tablet Take 2 tablets (40 mg total) by mouth daily with breakfast for 5 days. 10 tablet Erma Pinto F,  PA-C   sulfamethoxazole-trimethoprim (BACTRIM DS) 800-160 MG tablet Take 1 tablet by mouth 2 (two) times daily for 7 days. 14 tablet Tomi Bamberger, PA-C      PDMP not reviewed this encounter.   Tomi Bamberger, PA-C 02/14/22 1846

## 2022-02-14 NOTE — ED Triage Notes (Signed)
Pt presents with non productive cough, chest congestion, and chest tightness for over a week.

## 2022-03-11 IMAGING — CT CT ABD-PELV W/ CM
2 of 4 series · 17 of 46 positions shown, 19 images · IV contrast (omnipaque)
Comparison: CT 07/20/2015

CLINICAL DATA: RIGHT lower quadrant pain for 2 weeks.

EXAM:
CT ABDOMEN AND PELVIS WITH CONTRAST
TECHNIQUE: Multidetector CT imaging of the abdomen and pelvis was performed
using the standard protocol following bolus administration of
intravenous contrast.
CONTRAST:  100mL OMNIPAQUE IOHEXOL 300 MG/ML  SOLN

[Series 2: axial st · axial · 0.98mm/px · z∈[-438,+22]mm · 14 of 102 slices shown, 16 images]
[im 5/102  soft-tissue]
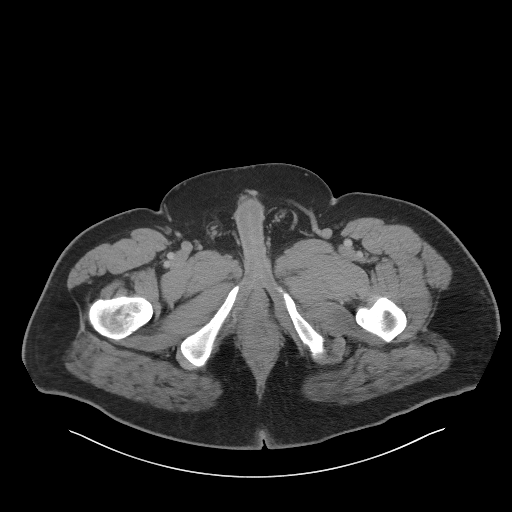
[im 5/102  bone]
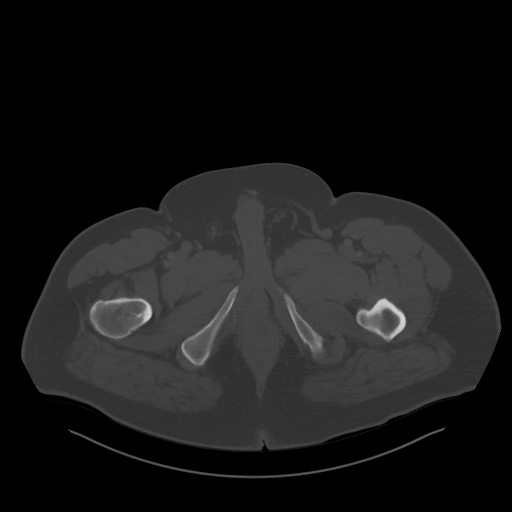
[im 13/102  soft-tissue]
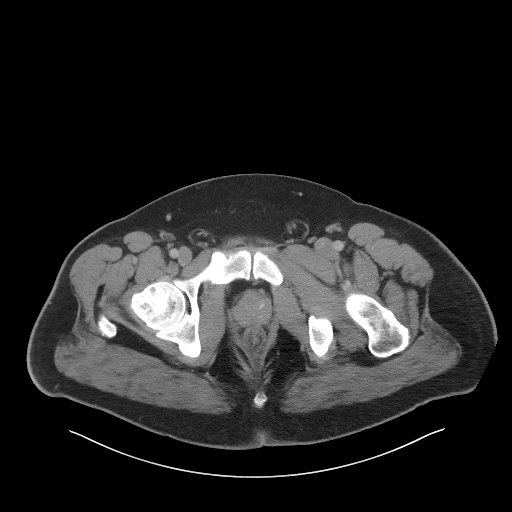
[im 22/102  soft-tissue]
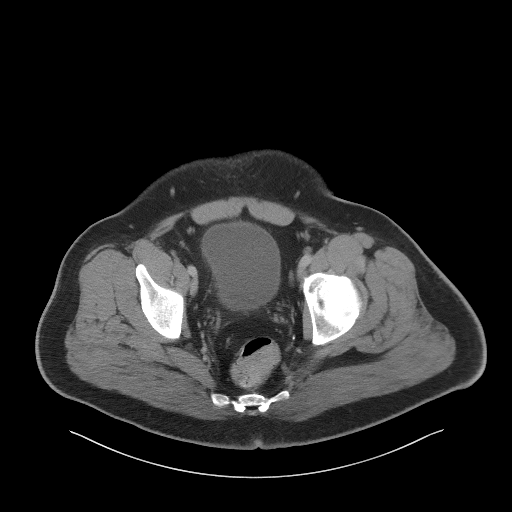
[im 26/102  soft-tissue]
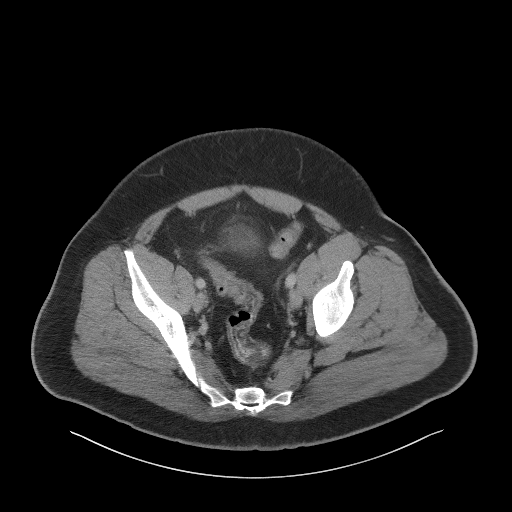
[im 34/102  soft-tissue]
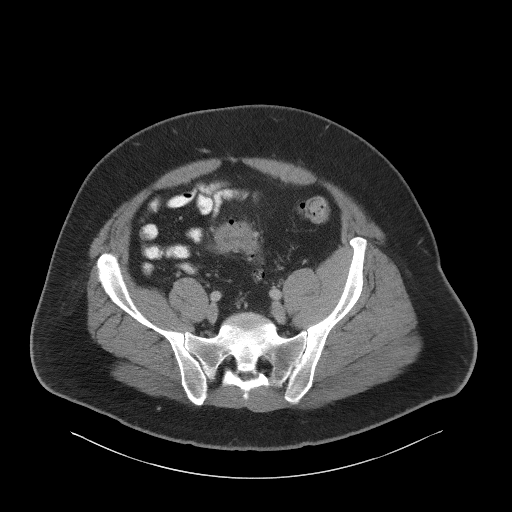
[im 43/102  soft-tissue]
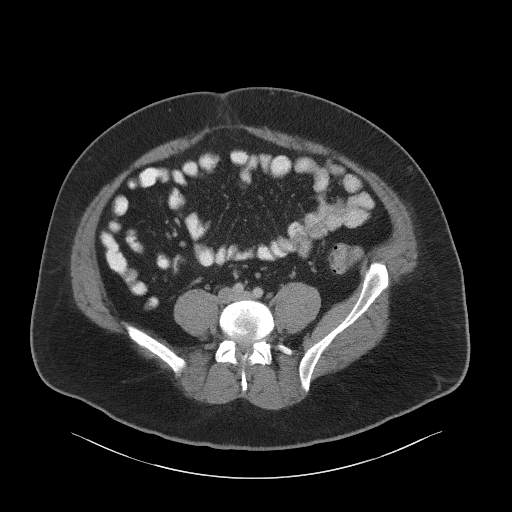
[im 47/102  soft-tissue]
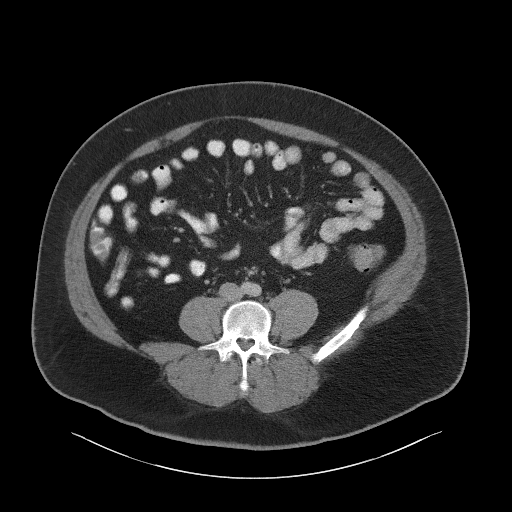
[im 55/102  soft-tissue]
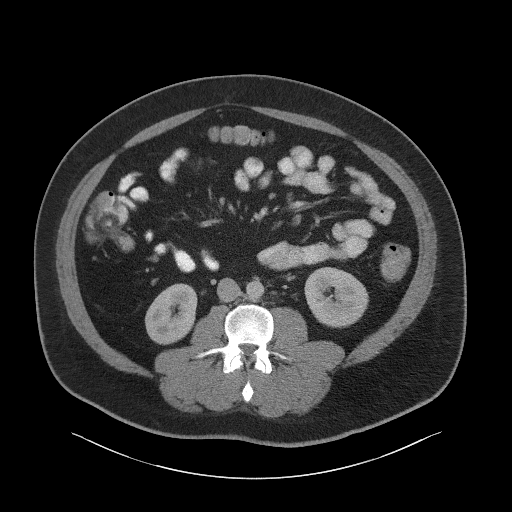
[im 59/102  soft-tissue]
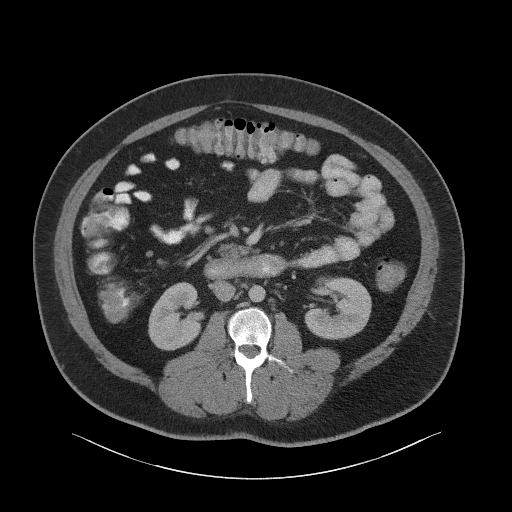
[im 59/102  bone]
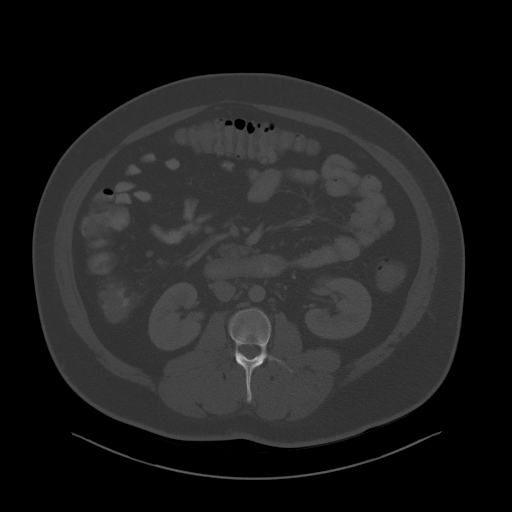
[im 68/102  soft-tissue]
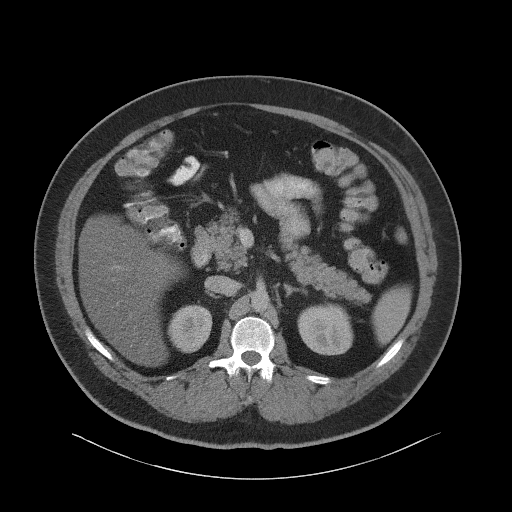
[im 76/102  soft-tissue]
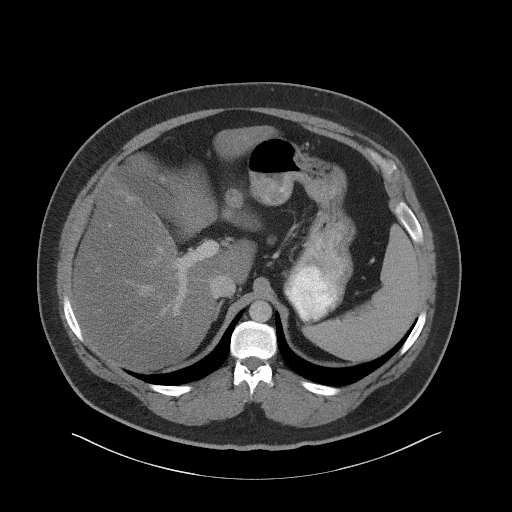
[im 80/102  soft-tissue]
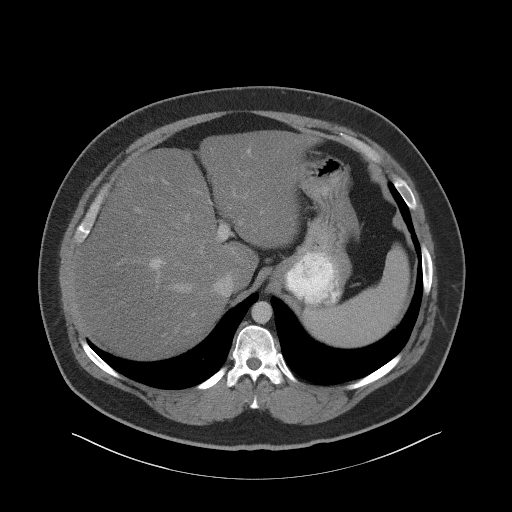
[im 89/102  soft-tissue]
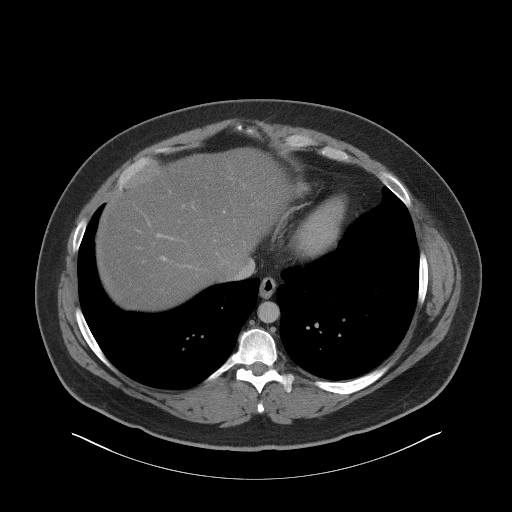
[im 97/102  soft-tissue]
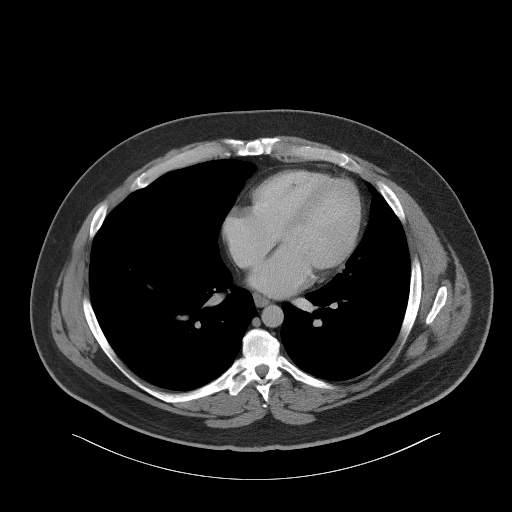

[Series 5: coronal st · coronal · 0.94mm/px · 3 of 125 slices shown]
[im 42/125  soft-tissue]
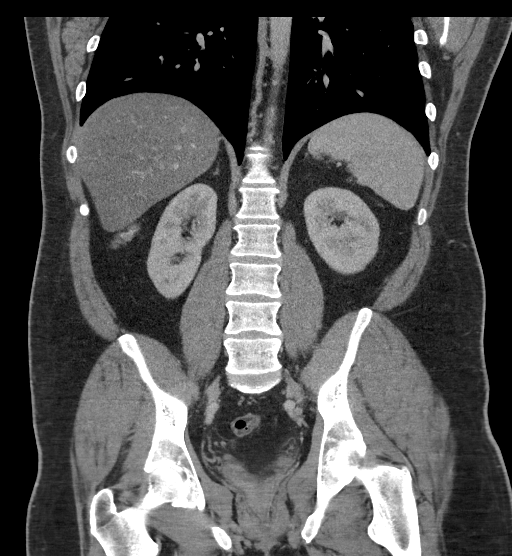
[im 56/125  soft-tissue]
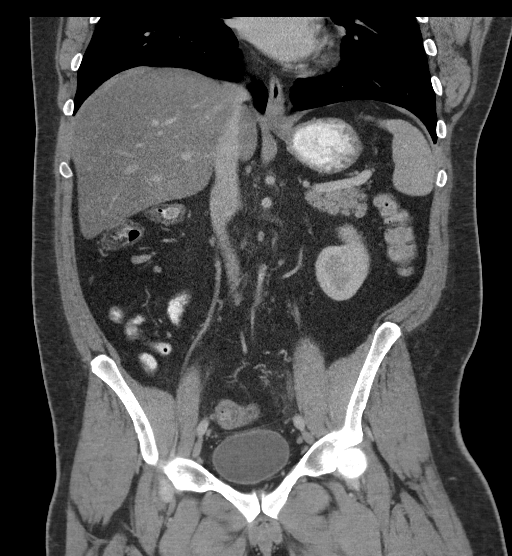
[im 69/125  soft-tissue]
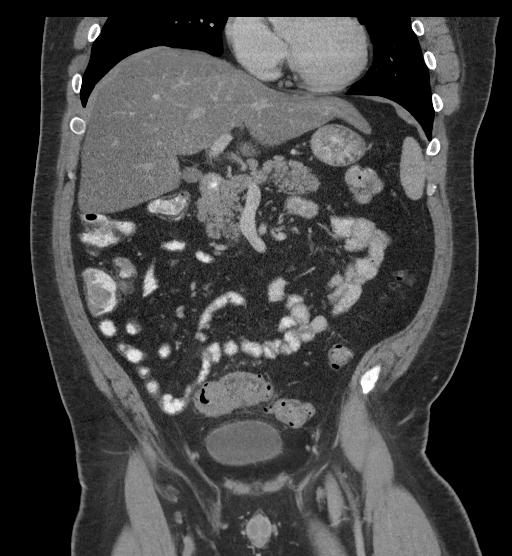

[17 of 46 positions shown; findings below may reference images not displayed]

FINDINGS: Lower chest: Lung bases are clear.

Hepatobiliary: Diffuse low-attenuation liver consistent hepatic
steatosis. Gallbladder normal.

Pancreas: Pancreas is normal. No ductal dilatation. No pancreatic
inflammation.

Spleen: Normal spleen

Adrenals/urinary tract: Adrenal glands and kidneys are normal. The
ureters and bladder normal.

Stomach/Bowel: Stomach, small-bowel and cecum are normal. The
appendix is not identified but there is no pericecal inflammation to
suggest appendicitis. Ascending transverse descending colon normal.

In the mid sigmoid colon there is pericolonic inflammation in the
adjacent fat. There are several diverticula through this region.
Findings most consistent acute diverticulitis. No macro perforation
or abscess. Rectum normal.

Vascular/Lymphatic: Abdominal aorta is normal caliber. No periportal
or retroperitoneal adenopathy. No pelvic adenopathy.

Reproductive: Unremarkable

Other: No free fluid.

Musculoskeletal: No aggressive osseous lesion.
IMPRESSION: 1. Acute diverticulitis of the sigmoid colon. No macro perforation
or abscess.
2. Hepatic steatosis.

These results will be called to the ordering clinician or
representative by the Radiologist Assistant, and communication
documented in the PACS or [REDACTED].

## 2022-04-29 ENCOUNTER — Ambulatory Visit
Admission: RE | Admit: 2022-04-29 | Discharge: 2022-04-29 | Disposition: A | Discharge status: Home / Self Care | Payer: Managed Care, Other (non HMO) | Source: Ambulatory Visit | Attending: Family Medicine | Admitting: Family Medicine

## 2022-04-29 ENCOUNTER — Other Ambulatory Visit: Payer: Self-pay | Admitting: Family Medicine

## 2022-04-29 DIAGNOSIS — R072 Precordial pain: Secondary | ICD-10-CM
